# Patient Record
Sex: Female | Born: 1990
Health system: Southern US, Community
[De-identification: ages and names within clinical notes are randomized; demographics above are authoritative.]

## PROBLEM LIST (undated history)

## (undated) HISTORY — PX: TONSILLECTOMY: SUR1361

---
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
## 2012-08-12 ENCOUNTER — Emergency Department (HOSPITAL_BASED_OUTPATIENT_CLINIC_OR_DEPARTMENT_OTHER)
Admission: EM | Admit: 2012-08-12 | Discharge: 2012-08-12 | Disposition: A | Discharge status: Home / Self Care | Payer: BC Managed Care – PPO | Attending: Emergency Medicine | Admitting: Emergency Medicine

## 2012-08-12 ENCOUNTER — Encounter (HOSPITAL_BASED_OUTPATIENT_CLINIC_OR_DEPARTMENT_OTHER): Payer: Self-pay | Admitting: *Deleted

## 2012-08-12 DIAGNOSIS — J3489 Other specified disorders of nose and nasal sinuses: Secondary | ICD-10-CM | POA: Insufficient documentation

## 2012-08-12 DIAGNOSIS — A5901 Trichomonal vulvovaginitis: Secondary | ICD-10-CM | POA: Insufficient documentation

## 2012-08-12 DIAGNOSIS — A599 Trichomoniasis, unspecified: Secondary | ICD-10-CM

## 2012-08-12 DIAGNOSIS — J069 Acute upper respiratory infection, unspecified: Secondary | ICD-10-CM | POA: Insufficient documentation

## 2012-08-12 DIAGNOSIS — Z3202 Encounter for pregnancy test, result negative: Secondary | ICD-10-CM | POA: Insufficient documentation

## 2012-08-12 DIAGNOSIS — R05 Cough: Secondary | ICD-10-CM | POA: Insufficient documentation

## 2012-08-12 DIAGNOSIS — R059 Cough, unspecified: Secondary | ICD-10-CM | POA: Insufficient documentation

## 2012-08-12 LAB — RAPID URINE DRUG SCREEN, HOSP PERFORMED
Amphetamines: NOT DETECTED
Barbiturates: NOT DETECTED
Tetrahydrocannabinol: POSITIVE — AB

## 2012-08-12 LAB — URINALYSIS, ROUTINE W REFLEX MICROSCOPIC
Glucose, UA: NEGATIVE mg/dL
Ketones, ur: NEGATIVE mg/dL
Specific Gravity, Urine: 1.022 (ref 1.005–1.030)
Urobilinogen, UA: 1 mg/dL (ref 0.0–1.0)

## 2012-08-12 LAB — WET PREP, GENITAL: Yeast Wet Prep HPF POC: NONE SEEN

## 2012-08-12 LAB — URINE MICROSCOPIC-ADD ON

## 2012-08-12 LAB — PREGNANCY, URINE: Preg Test, Ur: NEGATIVE

## 2012-08-12 MED ORDER — ONDANSETRON 4 MG PO TBDP
4.0000 mg | ORAL_TABLET | Freq: Once | ORAL | Status: AC
  Administered 2012-08-12: 4 mg via ORAL
  Filled 2012-08-12: qty 1

## 2012-08-12 MED ORDER — METRONIDAZOLE 500 MG PO TABS
2000.0000 mg | ORAL_TABLET | Freq: Once | ORAL | Status: AC
  Administered 2012-08-12: 2000 mg via ORAL
  Filled 2012-08-12: qty 4

## 2012-08-12 NOTE — ED Provider Notes (Signed)
History     CSN: [id number]  Arrival date & time 08/12/12  1357   First MD Initiated Contact with Patient 08/12/12 1427      Chief Complaint  Patient presents with  . Nausea  . URI    (Consider location/radiation/quality/duration/timing/severity/associated sxs/prior treatment) Patient is a 22 y.o. female presenting with URI.  URI Presenting symptoms: cough and rhinorrhea   Presenting symptoms: no fever     Patient presents with 4 days of nasal congestion and cough, and two days of nausea and "just not feeling well."  She says about a week ago she was "in a situation" and was in a jail cell and thinks she got a cold from that.  She says she feels nauseated but has not thrown up, says she is eating and drinking OK.  Denies fever, diarrhea, abdominal pain, dysuria.  She is sexually active, does not use protection, but denies vaginal discharge or history of STD's.    History reviewed. No pertinent past medical history.  History reviewed. No pertinent past surgical history.  History reviewed. No pertinent family history.  History  Substance Use Topics  . Smoking status: Never Smoker   . Smokeless tobacco: Not on file  . Alcohol Use: Yes     Comment: occ  Admits to Marijuana use.   Review of Systems  Constitutional: Negative for fever.  HENT: Positive for rhinorrhea.   Eyes: Negative for visual disturbance.  Respiratory: Positive for cough. Negative for shortness of breath.   Cardiovascular: Negative for chest pain.  Gastrointestinal: Positive for nausea. Negative for vomiting and abdominal pain.  Genitourinary: Negative for dysuria and vaginal discharge.  Skin: Negative for rash.  Hematological: Negative for adenopathy.    Allergies  Review of patient's allergies indicates no known allergies.  Home Medications  No current outpatient prescriptions on file.  BP 138/92  Pulse 56  Temp(Src) 98.2 F (36.8 C) (Oral)  Resp 16  Ht 5' 10.5" (1.791 m)  Wt 165 lb (74.844  kg)  BMI 23.33 kg/m2  SpO2 100%  LMP 07/26/2012  Physical Exam  Constitutional: She appears well-developed and well-nourished. No distress.  HENT:  Head: Normocephalic and atraumatic.  Right Ear: Tympanic membrane normal.  Left Ear: Tympanic membrane normal.  Nose: Rhinorrhea present.  Mouth/Throat: Uvula is midline, oropharynx is clear and moist and mucous membranes are normal.  Eyes: EOM are normal. Pupils are equal, round, and reactive to light.  Cardiovascular: Normal rate, regular rhythm and normal heart sounds.   Pulmonary/Chest: Effort normal and breath sounds normal. She has no rales.  Abdominal: Soft. Bowel sounds are normal. There is no tenderness.  Genitourinary:  Thick white discharge present in vaginal vault, mild friability of cervix, no cervical motion tenderness, ovaries and uterus normal.   Musculoskeletal: She exhibits no edema.  Lymphadenopathy:    She has no cervical adenopathy.    ED Course  Procedures (including critical care time)  Labs Reviewed  URINALYSIS, ROUTINE W REFLEX MICROSCOPIC - Abnormal; Notable for the following:    APPearance CLOUDY (*)    Leukocytes, UA MODERATE (*)    All other components within normal limits  URINE RAPID DRUG SCREEN (HOSP PERFORMED) - Abnormal; Notable for the following:    Tetrahydrocannabinol POSITIVE (*)    All other components within normal limits  URINE MICROSCOPIC-ADD ON - Abnormal; Notable for the following:    Squamous Epithelial / LPF FEW (*)    Bacteria, UA FEW (*)    All other components within normal  limits  URINE CULTURE  GC/CHLAMYDIA PROBE AMP  WET PREP, GENITAL  PREGNANCY, URINE  HIV ANTIBODY (ROUTINE TESTING)  RPR   Trichomonas present in urine  No results found.   1. Trichomonas   2. URI (upper respiratory infection)       MDM  2G PO flagyl given in ER, GC/Chlamydia, wet prep, and HIV/RPR collected in ER. Discussed diagnosis, importance of partner being treated, and safe sex practices.          Ardyth Gal, MD 08/12/12 [phone]

## 2012-08-12 NOTE — ED Notes (Signed)
POCT Urine discontinued by lab. Urine Preg ordered.

## 2012-08-12 NOTE — ED Notes (Signed)
3 days cough cold symptoms as well as feeling nauseated

## 2012-08-12 NOTE — ED Provider Notes (Signed)
I saw and evaluated the patient, reviewed the resident's note and I agree with the findings and plan.  4 days of URI symptoms and nausea.  Afebrile. Nontoxic appearing.   Glynn Octave, MD 08/12/12 [phone]

## 2012-08-13 LAB — URINE CULTURE: Culture: NO GROWTH

## 2012-08-13 LAB — GC/CHLAMYDIA PROBE AMP
CT Probe RNA: NEGATIVE
GC Probe RNA: NEGATIVE

## 2012-08-13 LAB — HIV ANTIBODY (ROUTINE TESTING W REFLEX): HIV: NONREACTIVE

## 2012-08-15 ENCOUNTER — Telehealth (HOSPITAL_COMMUNITY): Payer: Self-pay | Admitting: Emergency Medicine

## 2013-08-31 ENCOUNTER — Encounter (HOSPITAL_BASED_OUTPATIENT_CLINIC_OR_DEPARTMENT_OTHER): Payer: Self-pay | Admitting: Emergency Medicine

## 2013-08-31 ENCOUNTER — Emergency Department (HOSPITAL_BASED_OUTPATIENT_CLINIC_OR_DEPARTMENT_OTHER): Payer: BC Managed Care – PPO

## 2013-08-31 ENCOUNTER — Emergency Department (HOSPITAL_BASED_OUTPATIENT_CLINIC_OR_DEPARTMENT_OTHER)
Admission: EM | Admit: 2013-08-31 | Discharge: 2013-08-31 | Disposition: A | Discharge status: Home / Self Care | Payer: BC Managed Care – PPO | Attending: Emergency Medicine | Admitting: Emergency Medicine

## 2013-08-31 DIAGNOSIS — N760 Acute vaginitis: Secondary | ICD-10-CM | POA: Insufficient documentation

## 2013-08-31 DIAGNOSIS — B3731 Acute candidiasis of vulva and vagina: Secondary | ICD-10-CM | POA: Insufficient documentation

## 2013-08-31 DIAGNOSIS — A499 Bacterial infection, unspecified: Secondary | ICD-10-CM | POA: Insufficient documentation

## 2013-08-31 DIAGNOSIS — N390 Urinary tract infection, site not specified: Secondary | ICD-10-CM | POA: Insufficient documentation

## 2013-08-31 DIAGNOSIS — B9689 Other specified bacterial agents as the cause of diseases classified elsewhere: Secondary | ICD-10-CM | POA: Insufficient documentation

## 2013-08-31 DIAGNOSIS — Z3202 Encounter for pregnancy test, result negative: Secondary | ICD-10-CM | POA: Insufficient documentation

## 2013-08-31 DIAGNOSIS — B373 Candidiasis of vulva and vagina: Secondary | ICD-10-CM

## 2013-08-31 LAB — CBC WITH DIFFERENTIAL/PLATELET
BASOS PCT: 0 % (ref 0–1)
Basophils Absolute: 0 10*3/uL (ref 0.0–0.1)
Eosinophils Absolute: 0 10*3/uL (ref 0.0–0.7)
Eosinophils Relative: 0 % (ref 0–5)
HEMATOCRIT: 40 % (ref 36.0–46.0)
HEMOGLOBIN: 13.7 g/dL (ref 12.0–15.0)
Lymphocytes Relative: 10 % — ABNORMAL LOW (ref 12–46)
Lymphs Abs: 1.9 10*3/uL (ref 0.7–4.0)
MCH: 31.1 pg (ref 26.0–34.0)
MCHC: 34.3 g/dL (ref 30.0–36.0)
MCV: 90.9 fL (ref 78.0–100.0)
Monocytes Absolute: 1.7 10*3/uL — ABNORMAL HIGH (ref 0.1–1.0)
Monocytes Relative: 9 % (ref 3–12)
NEUTROS ABS: 15.4 10*3/uL — AB (ref 1.7–7.7)
NEUTROS PCT: 81 % — AB (ref 43–77)
PLATELETS: 226 10*3/uL (ref 150–400)
RBC: 4.4 MIL/uL (ref 3.87–5.11)
RDW: 13.7 % (ref 11.5–15.5)
WBC: 19 10*3/uL — AB (ref 4.0–10.5)

## 2013-08-31 LAB — PREGNANCY, URINE: Preg Test, Ur: NEGATIVE

## 2013-08-31 LAB — COMPREHENSIVE METABOLIC PANEL
ALBUMIN: 4.9 g/dL (ref 3.5–5.2)
ALK PHOS: 71 U/L (ref 39–117)
ALT: 12 U/L (ref 0–35)
AST: 20 U/L (ref 0–37)
BUN: 13 mg/dL (ref 6–23)
CHLORIDE: 99 meq/L (ref 96–112)
CO2: 24 mEq/L (ref 19–32)
CREATININE: 1 mg/dL (ref 0.50–1.10)
Calcium: 10 mg/dL (ref 8.4–10.5)
GFR calc non Af Amer: 80 mL/min — ABNORMAL LOW (ref >90)
GLUCOSE: 94 mg/dL (ref 70–99)
Potassium: 3.8 mEq/L (ref 3.7–5.3)
Sodium: 139 mEq/L (ref 137–147)
TOTAL PROTEIN: 8.1 g/dL (ref 6.0–8.3)
Total Bilirubin: 0.7 mg/dL (ref 0.3–1.2)

## 2013-08-31 LAB — LIPASE, BLOOD: Lipase: 14 U/L (ref 11–59)

## 2013-08-31 LAB — URINALYSIS, ROUTINE W REFLEX MICROSCOPIC
Glucose, UA: NEGATIVE mg/dL
KETONES UR: 15 mg/dL — AB
NITRITE: NEGATIVE
Protein, ur: 100 mg/dL — AB
SPECIFIC GRAVITY, URINE: 1.031 — AB (ref 1.005–1.030)
Urobilinogen, UA: 1 mg/dL (ref 0.0–1.0)
pH: 7.5 (ref 5.0–8.0)

## 2013-08-31 LAB — HIV ANTIBODY (ROUTINE TESTING W REFLEX): HIV: NONREACTIVE

## 2013-08-31 LAB — WET PREP, GENITAL: Trich, Wet Prep: NONE SEEN

## 2013-08-31 LAB — URINE MICROSCOPIC-ADD ON

## 2013-08-31 MED ORDER — IOHEXOL 300 MG/ML  SOLN
50.0000 mL | Freq: Once | INTRAMUSCULAR | Status: AC | PRN
  Administered 2013-08-31: 50 mL via ORAL

## 2013-08-31 MED ORDER — HYDROCODONE-ACETAMINOPHEN 5-325 MG PO TABS: 1.0000 | ORAL_TABLET | Freq: Four times a day (QID) | ORAL | Status: AC | PRN

## 2013-08-31 MED ORDER — IOHEXOL 300 MG/ML  SOLN
100.0000 mL | Freq: Once | INTRAMUSCULAR | Status: AC | PRN
  Administered 2013-08-31: 100 mL via INTRAVENOUS

## 2013-08-31 MED ORDER — FLUCONAZOLE 100 MG PO TABS: 100.0000 mg | ORAL_TABLET | Freq: Every day | ORAL | Status: AC

## 2013-08-31 MED ORDER — MORPHINE SULFATE 4 MG/ML IJ SOLN
4.0000 mg | Freq: Once | INTRAMUSCULAR | Status: AC
  Administered 2013-08-31: 4 mg via INTRAVENOUS
  Filled 2013-08-31: qty 1

## 2013-08-31 MED ORDER — ONDANSETRON HCL 4 MG/2ML IJ SOLN
4.0000 mg | Freq: Once | INTRAMUSCULAR | Status: AC
  Administered 2013-08-31: 4 mg via INTRAVENOUS
  Filled 2013-08-31: qty 2

## 2013-08-31 MED ORDER — METRONIDAZOLE 500 MG PO TABS: 500.0000 mg | ORAL_TABLET | Freq: Two times a day (BID) | ORAL | Status: AC

## 2013-08-31 MED ORDER — SULFAMETHOXAZOLE-TMP DS 800-160 MG PO TABS: 1.0000 | ORAL_TABLET | Freq: Two times a day (BID) | ORAL | Status: AC

## 2013-08-31 NOTE — ED Notes (Signed)
Pt. Stated abd pain started 07/31/13 all of a sudden. Pain 10/10. Pt been having nausea. Pt denies any problems urinating just has heaviness in abdomen. Denies vomiting, diarrhea and vaginal discharge.

## 2013-08-31 NOTE — ED Notes (Signed)
Patient given warm blanket.

## 2013-08-31 NOTE — Discharge Instructions (Signed)
Bacterial Vaginosis °Bacterial vaginosis is a vaginal infection that occurs when the normal balance of bacteria in the vagina is disrupted. It results from an overgrowth of certain bacteria. This is the most common vaginal infection in women of childbearing age. Treatment is important to prevent complications, especially in pregnant women, as it can cause a premature delivery. °CAUSES  °Bacterial vaginosis is caused by an increase in harmful bacteria that are normally present in smaller amounts in the vagina. Several different kinds of bacteria can cause bacterial vaginosis. However, the reason that the condition develops is not fully understood. °RISK FACTORS °Certain activities or behaviors can put you at an increased risk of developing bacterial vaginosis, including: °· Having a new sex partner or multiple sex partners. °· Douching. °· Using an intrauterine device (IUD) for contraception. °Women do not get bacterial vaginosis from toilet seats, bedding, swimming pools, or contact with objects around them. °SIGNS AND SYMPTOMS  °Some women with bacterial vaginosis have no signs or symptoms. Common symptoms include: °· Grey vaginal discharge. °· A fishlike odor with discharge, especially after sexual intercourse. °· Itching or burning of the vagina and vulva. °· Burning or pain with urination. °DIAGNOSIS  °Your health care provider will take a medical history and examine the vagina for signs of bacterial vaginosis. A sample of vaginal fluid may be taken. Your health care provider will look at this sample under a microscope to check for bacteria and abnormal cells. A vaginal pH test may also be done.  °TREATMENT  °Bacterial vaginosis may be treated with antibiotic medicines. These may be given in the form of a pill or a vaginal cream. A second round of antibiotics may be prescribed if the condition comes back after treatment.  °HOME CARE INSTRUCTIONS  °· Only take over-the-counter or prescription medicines as  directed by your health care provider. °· If antibiotic medicine was prescribed, take it as directed. Make sure you finish it even if you start to feel better. °· Do not have sex until treatment is completed. °· Tell all sexual partners that you have a vaginal infection. They should see their health care provider and be treated if they have problems, such as a mild rash or itching. °· Practice safe sex by using condoms and only having one sex partner. °SEEK MEDICAL CARE IF:  °· Your symptoms are not improving after 3 days of treatment. °· You have increased discharge or pain. °· You have a fever. °MAKE SURE YOU:  °· Understand these instructions. °· Will watch your condition. °· Will get help right away if you are not doing well or get worse. °FOR MORE INFORMATION  °Centers for Disease Control and Prevention, Division of STD Prevention: www.cdc.gov/std °American Sexual Health Association (ASHA): www.ashastd.org  °Document Released: 04/16/2005 Document Revised: 02/04/2013 Document Reviewed: 11/26/2012 °ExitCare® Patient Information ©2014 [employer]. °Urinary Tract Infection °A urinary tract infection (UTI) can occur any place along the urinary tract. The tract includes the kidneys, ureters, bladder, and urethra. A type of germ called bacteria often causes a UTI. UTIs are often helped with antibiotic medicine.  °HOME CARE  °· If given, take antibiotics as told by your doctor. Finish them even if you start to feel better. °· Drink enough fluids to keep your pee (urine) clear or pale yellow. °· Avoid tea, drinks with caffeine, and bubbly (carbonated) drinks. °· Pee often. Avoid holding your pee in for a long time. °· Pee before and after having sex (intercourse). °· Wipe from front to back   after you poop (bowel movement) if you are a woman. Use each tissue only once. °GET HELP RIGHT AWAY IF:  °· You have back pain. °· You have lower belly (abdominal) pain. °· You have chills. °· You feel sick to your stomach  (nauseous). °· You throw up (vomit). °· Your burning or discomfort with peeing does not go away. °· You have a fever. °· Your symptoms are not better in 3 days. °MAKE SURE YOU:  °· Understand these instructions. °· Will watch your condition. °· Will get help right away if you are not doing well or get worse. °Document Released: 10/03/2007 Document Revised: 01/09/2012 Document Reviewed: 11/15/2011 °ExitCare® Patient Information ©2014 [employer]. ° °

## 2013-08-31 NOTE — ED Provider Notes (Signed)
CSN: [id number]     Arrival date & time 08/31/13  1453 History   First MD Initiated Contact with Patient 08/31/13 1500     Chief Complaint  Patient presents with  . Abdominal Pain     (Consider location/radiation/quality/duration/timing/severity/associated sxs/prior Treatment) HPI Comments: Pt c/o generalized lower abdominal pain that started yesterday. Denies vomiting, dysuria, vaginal discharge or diarrhea. No fever. Pt states that she has no history of similar symptoms. Pain is about the her umbilical area. Normal bm.   The history is provided by the patient. No language interpreter was used.    History reviewed. No pertinent past medical history. Past Surgical History  Procedure Laterality Date  . Tonsillectomy     No family history on file. History  Substance Use Topics  . Smoking status: Never Smoker   . Smokeless tobacco: Not on file  . Alcohol Use: Yes     Comment: occ   OB History   Grav Para Term Preterm Abortions TAB SAB Ect Mult Living                 Review of Systems  Constitutional: Negative.   Respiratory: Negative.   Cardiovascular: Negative.       Allergies  Review of patient's allergies indicates no known allergies.  Home Medications   Prior to Admission medications   Not on File   BP 150/85  Pulse 92  Temp(Src) 98.8 F (37.1 C) (Oral)  Resp 16  Ht 5\' 10"  (1.778 m)  Wt 165 lb (74.844 kg)  BMI 23.68 kg/m2  SpO2 100%  LMP 08/10/2013 Physical Exam  Nursing note and vitals reviewed. Constitutional: She is oriented to person, place, and time. She appears well-developed and well-nourished.  HENT:  Head: Atraumatic.  Cardiovascular: Normal rate and regular rhythm.   Pulmonary/Chest: Effort normal and breath sounds normal.  Abdominal: Soft. There is tenderness in the periumbilical area. There is guarding.  Genitourinary:  White discharge, malodarous  Musculoskeletal: Normal range of motion.  Neurological: She is alert and oriented to  person, place, and time.  Skin: Skin is warm and dry.  Psychiatric: She has a normal mood and affect.    ED Course  Procedures (including critical care time) Labs Review Labs Reviewed  URINALYSIS, ROUTINE W REFLEX MICROSCOPIC - Abnormal; Notable for the following:    Color, Urine AMBER (*)    APPearance CLOUDY (*)    Specific Gravity, Urine 1.031 (*)    Hgb urine dipstick MODERATE (*)    Bilirubin Urine SMALL (*)    Ketones, ur 15 (*)    Protein, ur 100 (*)    Leukocytes, UA MODERATE (*)    All other components within normal limits  COMPREHENSIVE METABOLIC PANEL - Abnormal; Notable for the following:    GFR calc non Af Amer 80 (*)    All other components within normal limits  CBC WITH DIFFERENTIAL - Abnormal; Notable for the following:    WBC 19.0 (*)    Neutrophils Relative % 81 (*)    Neutro Abs 15.4 (*)    Lymphocytes Relative 10 (*)    Monocytes Absolute 1.7 (*)    All other components within normal limits  URINE MICROSCOPIC-ADD ON - Abnormal; Notable for the following:    Squamous Epithelial / LPF MANY (*)    Bacteria, UA FEW (*)    All other components within normal limits  GC/CHLAMYDIA PROBE AMP  WET PREP, GENITAL  PREGNANCY, URINE  LIPASE, BLOOD  HIV ANTIBODY (ROUTINE TESTING)  Imaging Review Ct Abdomen Pelvis W Contrast  08/31/2013   CLINICAL DATA:  Abdominal pain beginning 1 day ago.  EXAM: CT ABDOMEN AND PELVIS WITH CONTRAST  TECHNIQUE: Multidetector CT imaging of the abdomen and pelvis was performed using the standard protocol following bolus administration of intravenous contrast.  CONTRAST:  50 mL OMNIPAQUE IOHEXOL 300 MG/ML SOLN, 100 mL OMNIPAQUE IOHEXOL 300 MG/ML SOLN  COMPARISON:  None.  FINDINGS: The lung bases demonstrate only mild atelectatic change on the left. There is no pleural or pericardial effusion.  The gallbladder, liver, spleen, adrenal glands, pancreas, kidneys and biliary tree appear normal. A very small amount of free pelvic fluid is  compatible with physiologic change. Uterus, adnexa and urinary bladder appear normal. The stomach, small and large bowel and appendix all appear normal. There is no lymphadenopathy. No focal bony abnormality is identified.  IMPRESSION: Negative for appendicitis or other acute abnormality. Negative abdomen and pelvis CT scan.   Electronically Signed   By: Drusilla Kannerhomas  Dalessio M.D.   On: 08/31/2013 16:21     EKG Interpretation None      MDM   Final diagnoses:  UTI (lower urinary tract infection)  Yeast vaginitis  BV (bacterial vaginosis)    No acute abnormality noted on ct. Pt is not pregnant. Will treat for bv, uti and yeast. Std cultures sent. Urine culture sent    Teressa LowerVrinda Hever Castilleja, NP 08/31/13 1654

## 2013-09-01 LAB — GC/CHLAMYDIA PROBE AMP
CT Probe RNA: NEGATIVE
GC Probe RNA: NEGATIVE

## 2013-09-01 NOTE — ED Provider Notes (Signed)
Medical screening examination/treatment/procedure(s) were performed by non-physician practitioner and as supervising physician I was immediately available for consultation/collaboration.     Geoffery Lyonsouglas Jaliana Medellin, MD 09/01/13 [phone]

## 2013-09-02 LAB — URINE CULTURE: Colony Count: >=100000

## 2013-12-27 ENCOUNTER — Encounter (HOSPITAL_BASED_OUTPATIENT_CLINIC_OR_DEPARTMENT_OTHER): Payer: Self-pay | Admitting: Emergency Medicine

## 2013-12-27 ENCOUNTER — Emergency Department (HOSPITAL_BASED_OUTPATIENT_CLINIC_OR_DEPARTMENT_OTHER): Payer: BC Managed Care – PPO

## 2013-12-27 ENCOUNTER — Emergency Department (HOSPITAL_BASED_OUTPATIENT_CLINIC_OR_DEPARTMENT_OTHER)
Admission: EM | Admit: 2013-12-27 | Discharge: 2013-12-27 | Disposition: A | Discharge status: Home / Self Care | Payer: BC Managed Care – PPO | Attending: Emergency Medicine | Admitting: Emergency Medicine

## 2013-12-27 DIAGNOSIS — M25579 Pain in unspecified ankle and joints of unspecified foot: Secondary | ICD-10-CM | POA: Diagnosis not present

## 2013-12-27 DIAGNOSIS — Z7901 Long term (current) use of anticoagulants: Secondary | ICD-10-CM | POA: Insufficient documentation

## 2013-12-27 DIAGNOSIS — Z880 Allergy status to penicillin: Secondary | ICD-10-CM | POA: Diagnosis not present

## 2013-12-27 DIAGNOSIS — M79609 Pain in unspecified limb: Secondary | ICD-10-CM | POA: Insufficient documentation

## 2013-12-27 DIAGNOSIS — M79671 Pain in right foot: Secondary | ICD-10-CM

## 2013-12-27 DIAGNOSIS — Z792 Long term (current) use of antibiotics: Secondary | ICD-10-CM | POA: Diagnosis not present

## 2013-12-27 DIAGNOSIS — M79604 Pain in right leg: Secondary | ICD-10-CM

## 2013-12-27 MED ORDER — NAPROXEN 500 MG PO TABS: 500.0000 mg | ORAL_TABLET | Freq: Two times a day (BID) | ORAL | Status: AC

## 2013-12-27 NOTE — ED Provider Notes (Signed)
CSN: [id number]     Arrival date & time 12/27/13  1248 History   First MD Initiated Contact with Patient 12/27/13 1325     Chief Complaint  Patient presents with  . Leg Pain      HPI  Includes pain right heel. No pain or. Limping because of the heel pain now feels tightness and swelling in right calf. Normally works out at [employer] is limited by the pain last week. PE. History DVT. No family history of clotting disorders.  History reviewed. No pertinent past medical history. Past Surgical History  Procedure Laterality Date  . Tonsillectomy     No family history on file. History  Substance Use Topics  . Smoking status: Never Smoker   . Smokeless tobacco: Not on file  . Alcohol Use: Yes     Comment: occ   OB History   Grav Para Term Preterm Abortions TAB SAB Ect Mult Living                 Review of Systems Limited in the right heel. No tightness in the cath. No pain in the thigh. Pulmonary: no difficult breathing cardiac-no symptoms, No other areas of pain or swelling. You of systems otherwise complete and negative.   Allergies  Penicillins  Home Medications   Prior to Admission medications   Medication Sig Start Date End Date Taking? Authorizing Provider  HYDROcodone-acetaminophen (NORCO/VICODIN) 5-325 MG per tablet Take 1-2 tablets by mouth every 6 (six) hours as needed. 08/31/13   Teressa Lower, NP  metroNIDAZOLE (FLAGYL) 500 MG tablet Take 1 tablet (500 mg total) by mouth 2 (two) times daily. 08/31/13   Teressa Lower, NP  naproxen (NAPROSYN) 500 MG tablet Take 1 tablet (500 mg total) by mouth 2 (two) times daily. 12/27/13   Rolland Porter, MD  sulfamethoxazole-trimethoprim (BACTRIM DS) 800-160 MG per tablet Take 1 tablet by mouth 2 (two) times daily. 08/31/13   Teressa Lower, NP   BP 134/93  Pulse 65  Temp(Src) 97.9 F (36.6 C) (Oral)  Resp 20  Ht  (1.778 m)  Wt 180 lb (81.647 kg)  BMI 25.83 kg/m2  SpO2 100%  LMP 12/10/2013 Physical Exam  Normal pulses  or other extremity. No soft tissue swelling noted. However, her right leg is 1 cm bigger than her left. No edema distally. Good capillary refill and sensation. Tenderness on the heel pad. No erythema warmth or cellulitis.  ED Course  Procedures (including critical care time) Labs Review Labs Reviewed - No data to display  Imaging Review US Venous Img Lower Unilateral Right  12/27/2013   CLINICAL DATA:  Right leg and calf pain  EXAM: Right LOWER EXTREMITY VENOUS DOPPLER ULTRASOUND  TECHNIQUE: Gray-scale sonography with graded compression, as well as color Doppler and duplex ultrasound were performed to evaluate the lower extremity deep venous systems from the level of the common femoral vein and including the common femoral, femoral, profunda femoral, popliteal and calf veins including the posterior tibial, peroneal and gastrocnemius veins when visible. The superficial great saphenous vein was also interrogated. Spectral Doppler was utilized to evaluate flow at rest and with distal augmentation maneuvers in the common femoral, femoral and popliteal veins.  COMPARISON:  None.  FINDINGS: Common Femoral Vein: No evidence of thrombus. Normal compressibility, respiratory phasicity and response to augmentation.  Saphenofemoral Junction: No evidence of thrombus. Normal compressibility and flow on color Doppler imaging.  Profunda Femoral Vein: No evidence of thrombus. Normal compressibility and flow on color Doppler  imaging.  Femoral Vein: No evidence of thrombus. Normal compressibility, respiratory phasicity and response to augmentation.  Popliteal Vein: No evidence of thrombus. Normal compressibility, respiratory phasicity and response to augmentation.  Calf Veins: No evidence of thrombus. Normal compressibility and flow on color Doppler imaging.  Superficial Great Saphenous Vein: No evidence of thrombus. Normal compressibility and flow on color Doppler imaging.  Venous Reflux:  None.  Other Findings:  None.   IMPRESSION: No evidence of deep venous thrombosis.   Electronically Signed   By: Alcide Clever M.D.   On: 12/27/2013 14:13     EKG Interpretation None      MDM   Final diagnoses:  Pain of right lower extremity  Heel pain, right        Rolland Porter, MD 12/30/13 [phone]

## 2013-12-27 NOTE — ED Notes (Signed)
PT discharged to home with family, NAD.  

## 2013-12-27 NOTE — Discharge Instructions (Signed)
Heel Compression Syndrome  with Rehab Heel compression syndrome causes shrinkage (atrophy) of the heel pad, which causes heel pain. A fat pad exists beneath the heel bone (calcaneus) that absorbs shock and other forces that are applied to the foot. Shrinkage of this tissue results in an inability to withstand normal forces applied to the foot.  SYMPTOMS   General pain on the bottom of the heel.  Pain that gets worse when running on hard surfaces or in a shoe with poor shock absorbers.  No swelling or increased warmth.  Less cushion on bottom of the heel. CAUSES  Heel compression syndrome is caused by shrinkage of the heel pad, after the age of 7. This process may be increased with distance running or with fracture of the heel bone or other injury to the heel. RISK INCREASES WITH:  Sports that require running on a hard surface.  Prolonged standing.  Poor strength and flexibility.  Shoes with poor absorbers.  Obesity.  Flat feet.  Fracture of the heel bone. PREVENTION   Warm up and stretch properly before activity.  Maintain physical fitness:  Strength, flexibility and endurance.  Cardiovascular fitness.  Maintain a healthy body weight.  Avoid activities that place constant or repeated strain on the foot.  Wear properly fitted and padded shoes.  Change shoes every 300 to 500 miles.  When possible, run on soft surfaces.  Wear a heel lift to reduce pressure to the heel (pushing weight to the front of the foot).  Emphasize cross training. PROGNOSIS  If treated properly, heel compression syndrome may be cured. However, sometimes heel compression syndrome results in a chronic condition. RELATED COMPLICATIONS  Frequently recurring symptoms, resulting in a chronic problem that often affects your ability to compete.  TREATMENT  Treatment first involves ice and medicine to reduce pain and inflammation. It is important to perform exercises that stretch the heel cord, and  to modify activities that aggravate symptoms. These exercises may be performed at home or with a therapist. You may find changing shoes or using a heel insert helpful in reducing pain and discomfort. There are no surgical procedures that exist to treat this condition.  MEDICATION  If pain medicine is needed, nonsteroidal anti-inflammatory medicines (aspirin and ibuprofen), or other minor pain relievers (acetaminophen), are often advised  Prescription pain relievers may be given if your caregiver thinks they are needed. Use only as directed and only as much as you need. HEAT AND COLD  Cold treatment (icing) relieves pain and reduces inflammation. Cold treatment should be applied for 10 to 15 minutes every 2 to 3 hours, and immediately after activity that aggravates your symptoms. Use ice packs or an ice massage.  Heat treatment may be used before performing stretching and strengthening activities prescribed by your caregiver, physical therapist, or athletic trainer. Use a heat pack or a warm water soak. SEEK MEDICAL CARE IF:  Symptoms get worse or do not improve in 2 weeks, despite treatment. EXERCISES  STRETCHING EXERCISES - Heel Compression Syndrome (Fat Pad Atrophy) These exercises may help you when beginning to rehabilitate your injury. Your symptoms may go away with or without further involvement from your physician, physical therapist or athletic trainer. While completing these exercises, remember:   Restoring tissue flexibility helps normal motion to return to the joints. This allows healthier, less painful movement and activity.  An effective stretch should be held for at least 30 seconds.  A stretch should never be painful. You should only feel a gentle lengthening  or release in the stretched tissue. STRETCH - Gastroc, Standing   Place your hands on a wall.  Extend your right / left leg behind you, keeping the front knee somewhat bent.  Slightly point your toes inward on your back  foot.  Keeping your right / left heel on the floor and your knee straight, shift your weight toward the wall, not allowing your back to arch.  You should feel a gentle stretch in the right / left calf. Hold this position Repeat __________ times. Complete this stretch __________ times per day.

## 2013-12-27 NOTE — ED Notes (Signed)
PT presents to ED with complaints of rt calf pain for 2 days

## 2015-02-19 IMAGING — CT CT ABD-PELV W/ CM
2 of 4 series · 16 of 46 positions shown, 18 images · IV contrast (APPLIED)
Comparison: None.

CLINICAL DATA: Abdominal pain beginning 1 day ago.

EXAM:
CT ABDOMEN AND PELVIS WITH CONTRAST
TECHNIQUE: Multidetector CT imaging of the abdomen and pelvis was performed
using the standard protocol following bolus administration of
intravenous contrast.
CONTRAST:  50 mL OMNIPAQUE IOHEXOL 300 MG/ML SOLN, 100 mL OMNIPAQUE
IOHEXOL 300 MG/ML SOLN

[Series 2: abd/pelvis 5.0 b31f · axial · 0.83mm/px · z∈[-419,+36]mm · 13 of 99 slices shown, 15 images]
[im 4/99  soft-tissue]
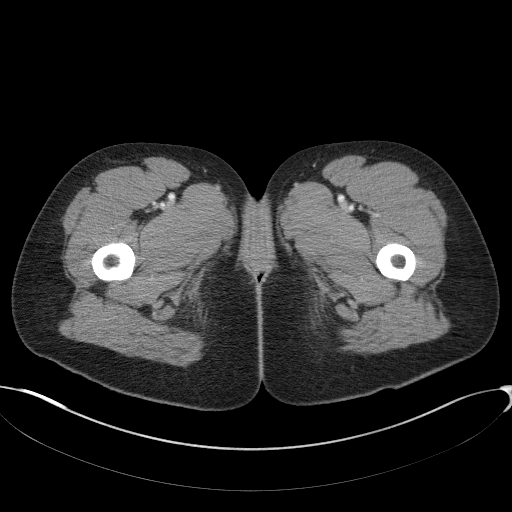
[im 4/99  bone]
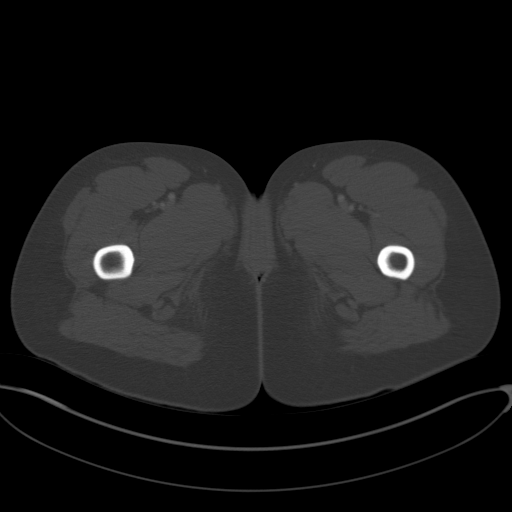
[im 12/99  soft-tissue]
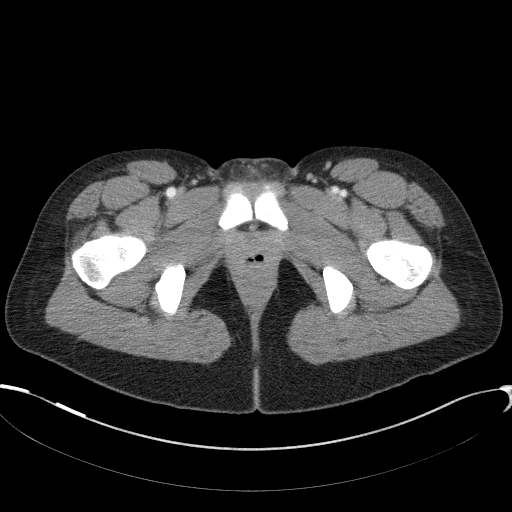
[im 20/99  soft-tissue]
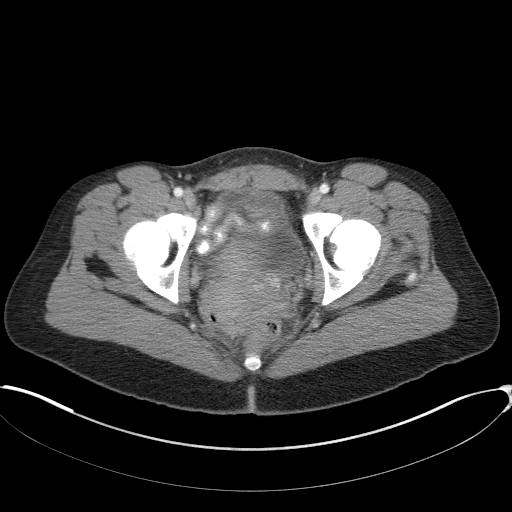
[im 28/99  soft-tissue]
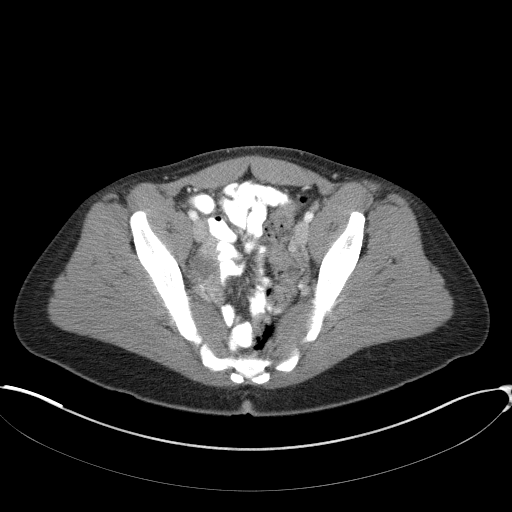
[im 36/99  soft-tissue]
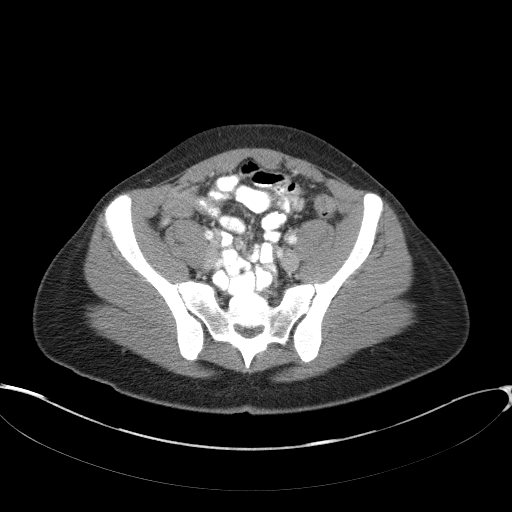
[im 44/99  soft-tissue]
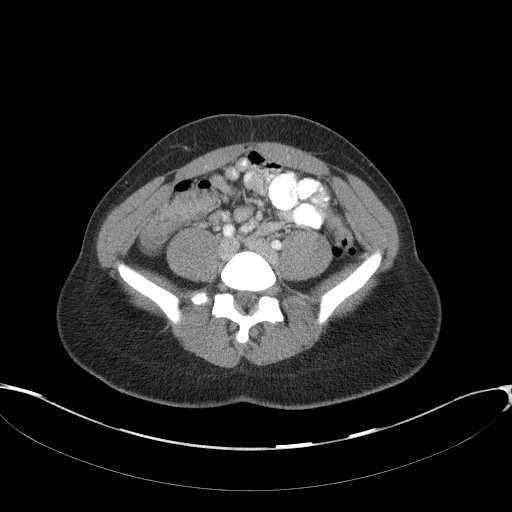
[im 51/99  soft-tissue]
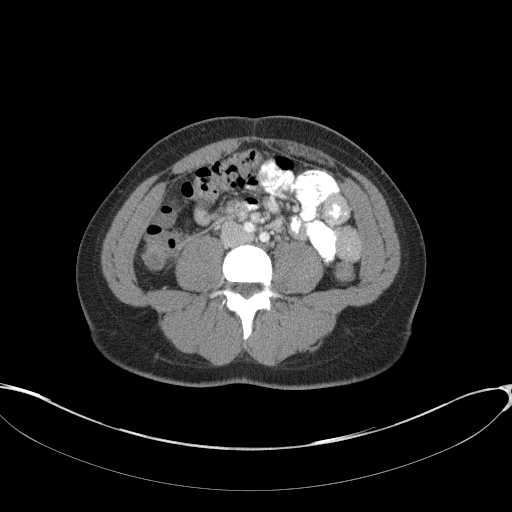
[im 55/99  soft-tissue]
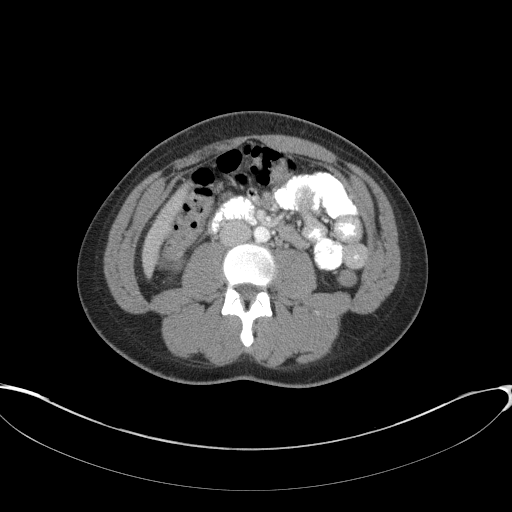
[im 63/99  soft-tissue]
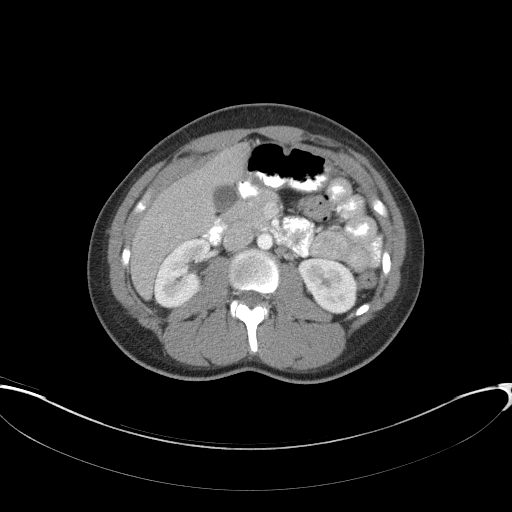
[im 63/99  bone]
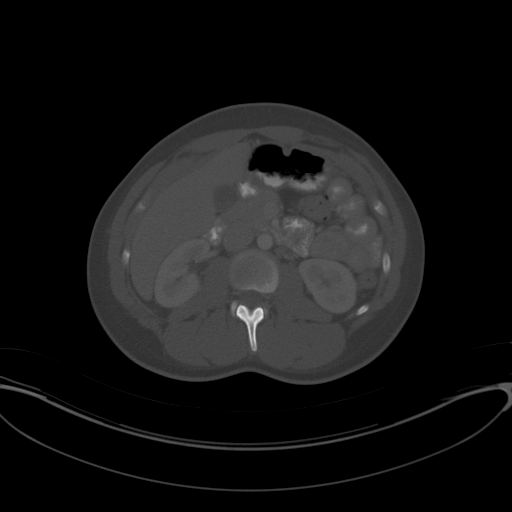
[im 71/99  soft-tissue]
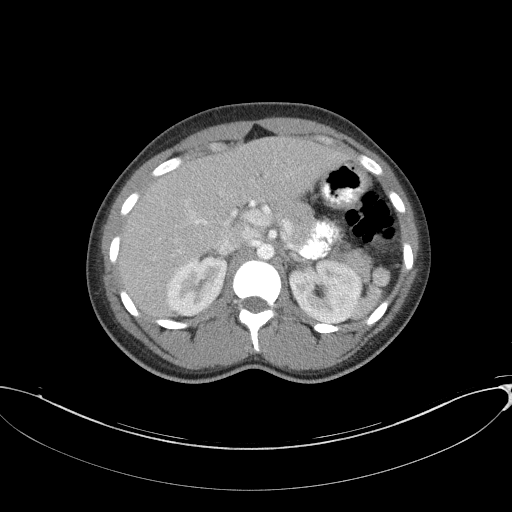
[im 79/99  soft-tissue]
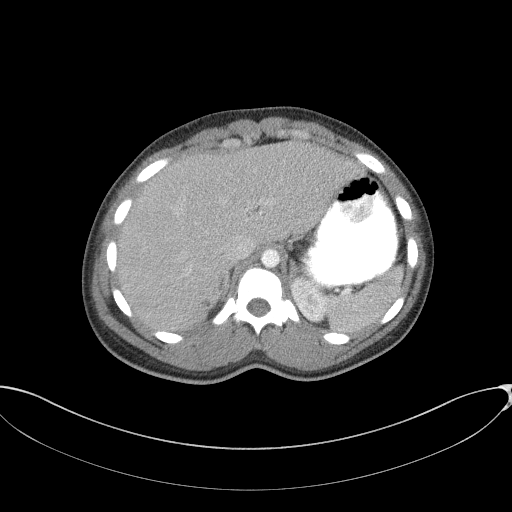
[im 87/99  soft-tissue]
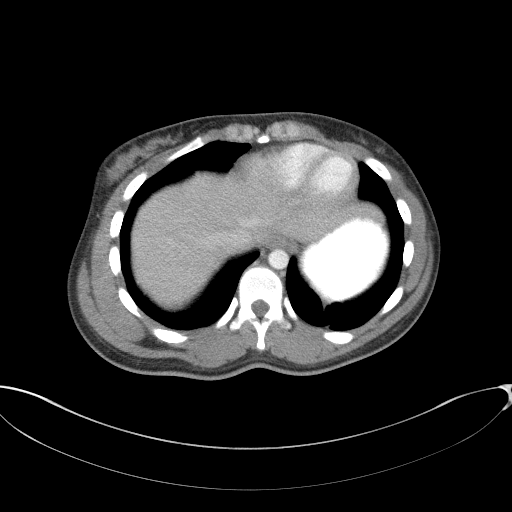
[im 95/99  soft-tissue]
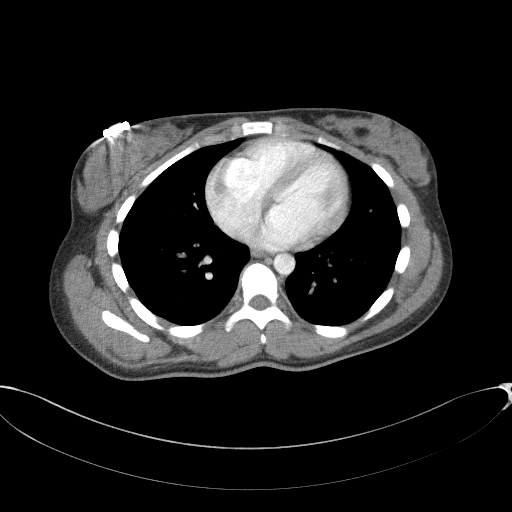

[Series 5: abd/pelvis 3.0 coronal · coronal · 0.76mm/px · 3 of 86 slices shown]
[im 29/86  soft-tissue]
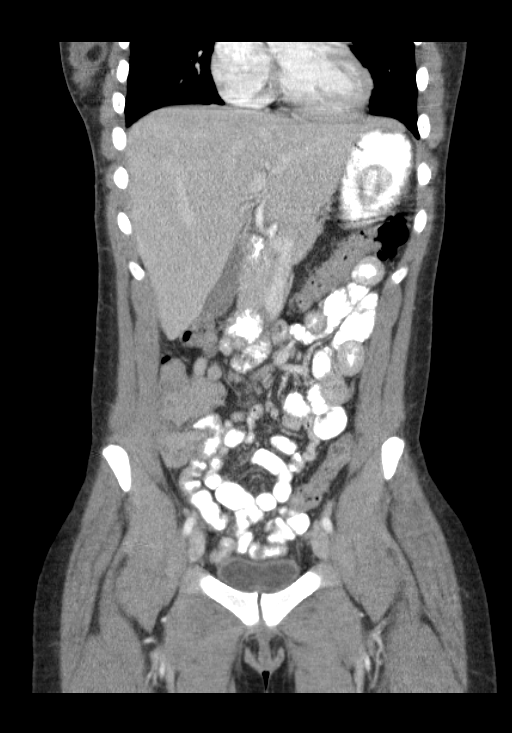
[im 38/86  soft-tissue]
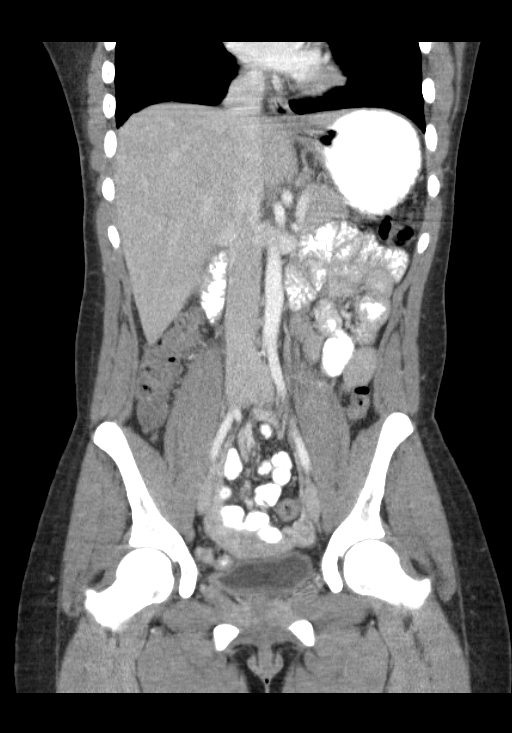
[im 48/86  soft-tissue]
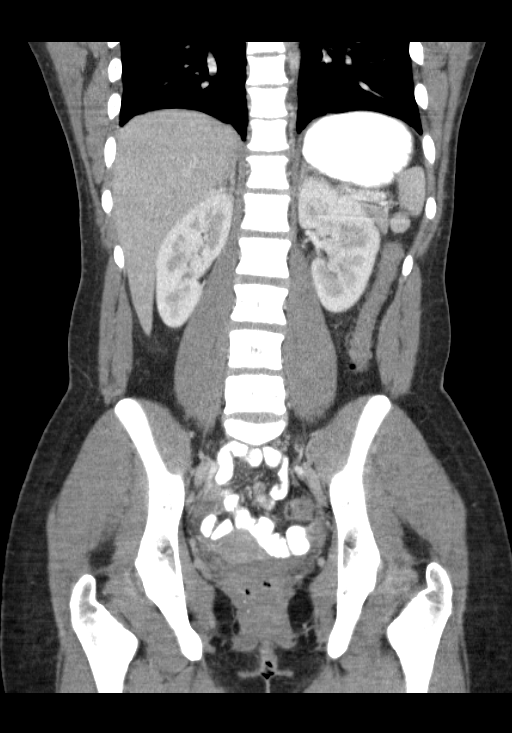

[16 of 46 positions shown; findings below may reference images not displayed]

FINDINGS: The lung bases demonstrate only mild atelectatic change on the left.
There is no pleural or pericardial effusion.

The gallbladder, liver, spleen, adrenal glands, pancreas, kidneys
and biliary tree appear normal. A very small amount of free pelvic
fluid is compatible with physiologic change. Uterus, adnexa and
urinary bladder appear normal. The stomach, small and large bowel
and appendix all appear normal. There is no lymphadenopathy. No
focal bony abnormality is identified.
IMPRESSION: Negative for appendicitis or other acute abnormality. Negative
abdomen and pelvis CT scan.

## 2015-06-17 IMAGING — US US EXTREM LOW VENOUS*R*
1 series · 13 of 24 positions shown · non-contrast
Comparison: None.

CLINICAL DATA: Right leg and calf pain



[Series 1: us extrem low venous*right* · 0.06mm/px · 13 of 37 slices shown]
[im 1/37]
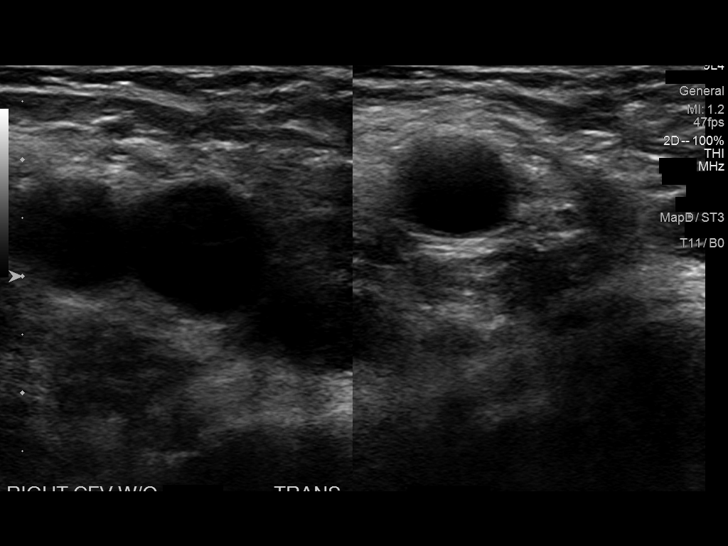
[im 4/37]
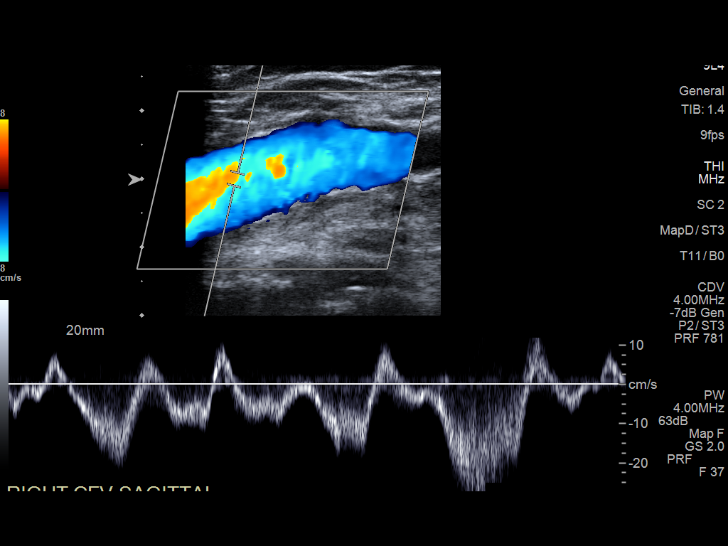
[im 7/37]
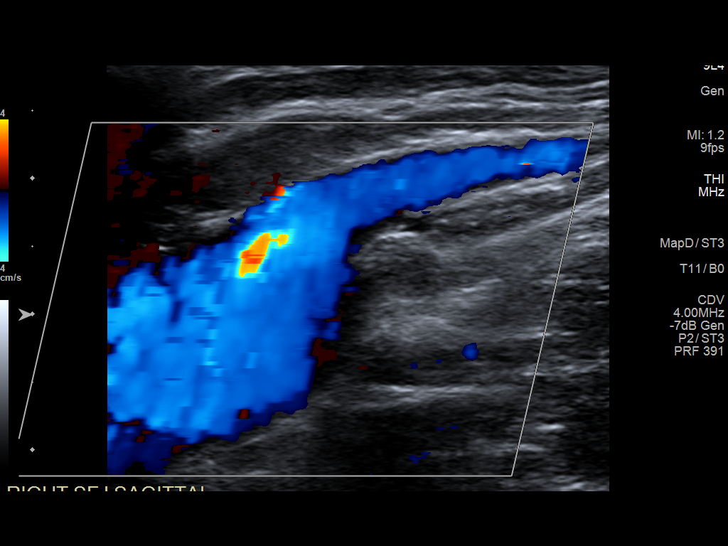
[im 10/37]
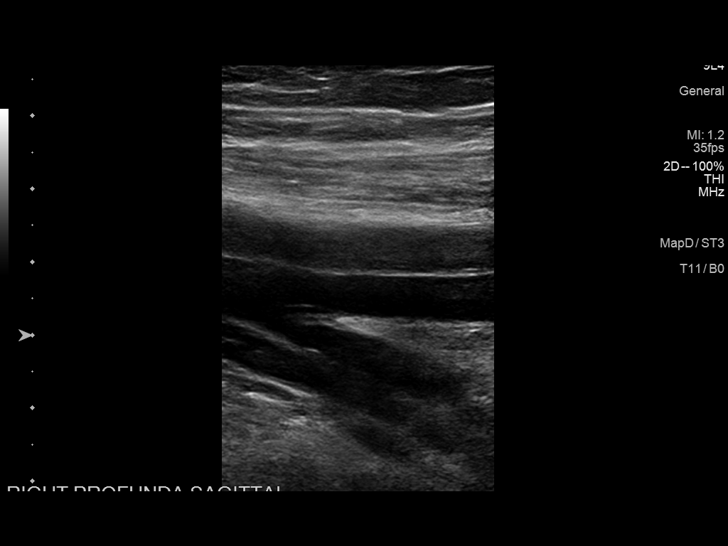
[im 13/37]
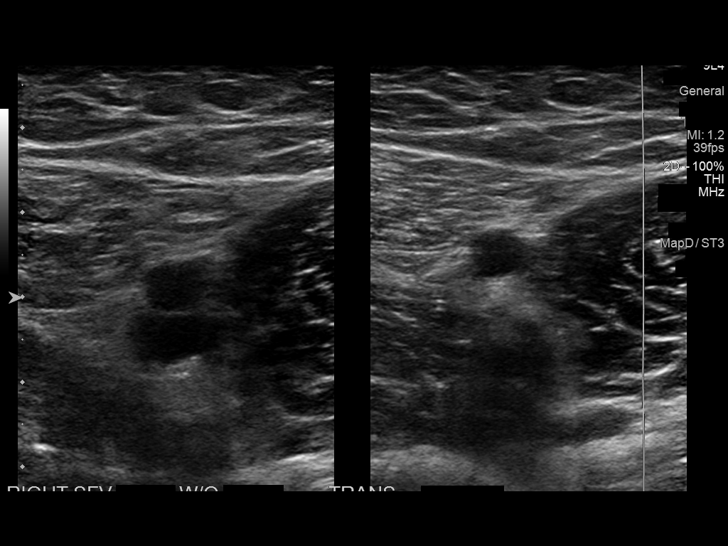
[im 16/37]
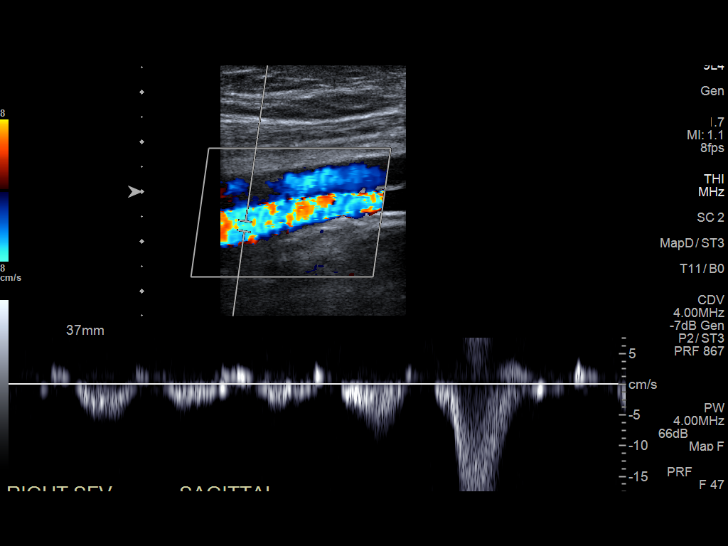
[im 19/37]
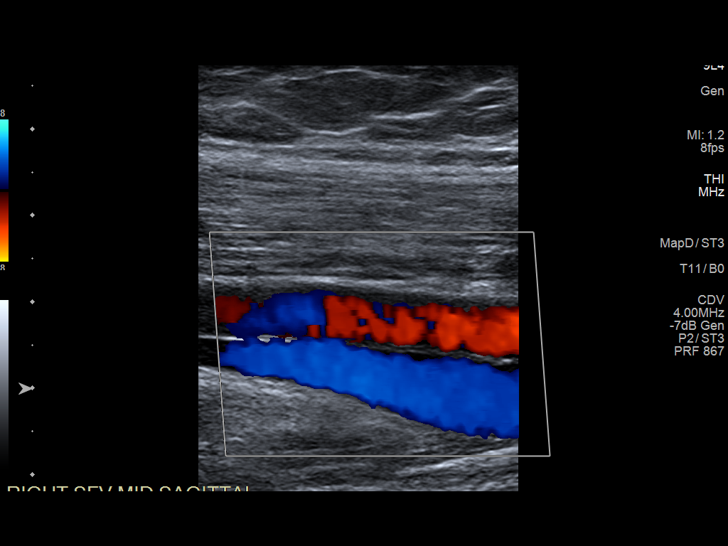
[im 21/37]
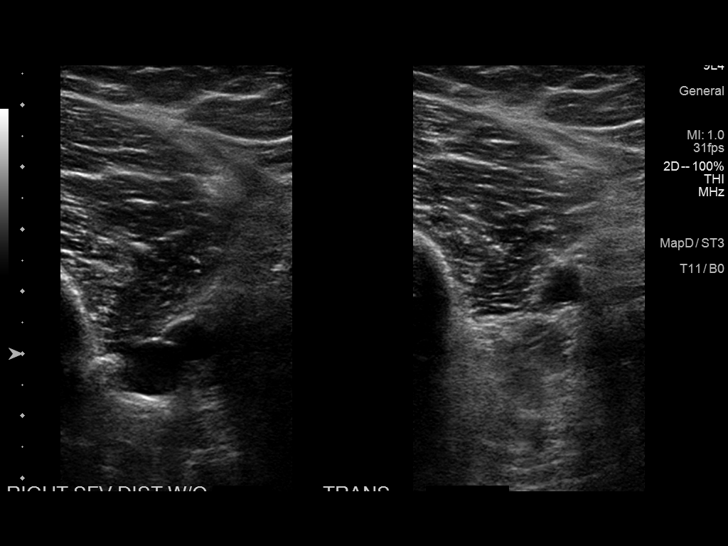
[im 24/37]
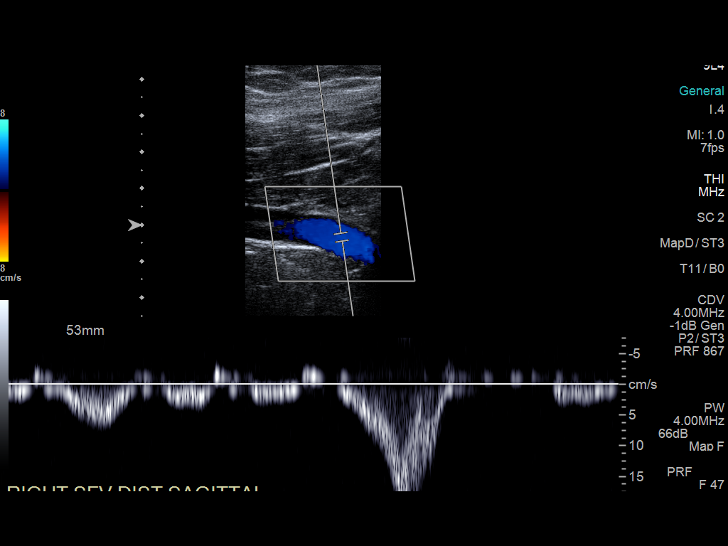
[im 27/37]
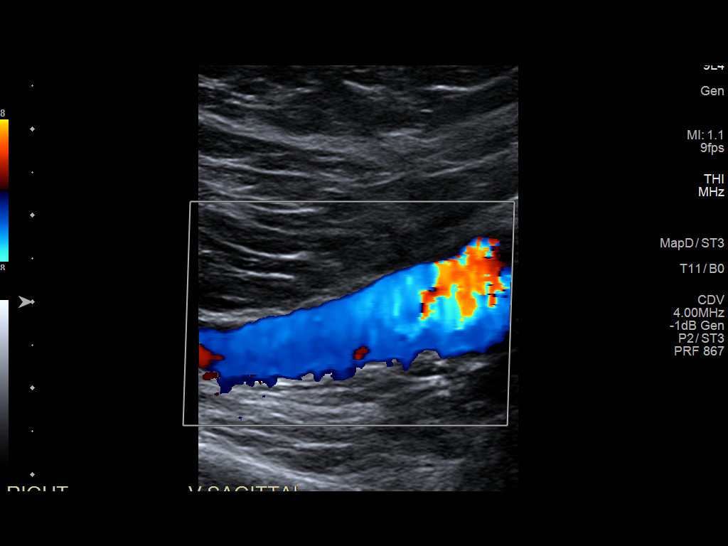
[im 30/37]
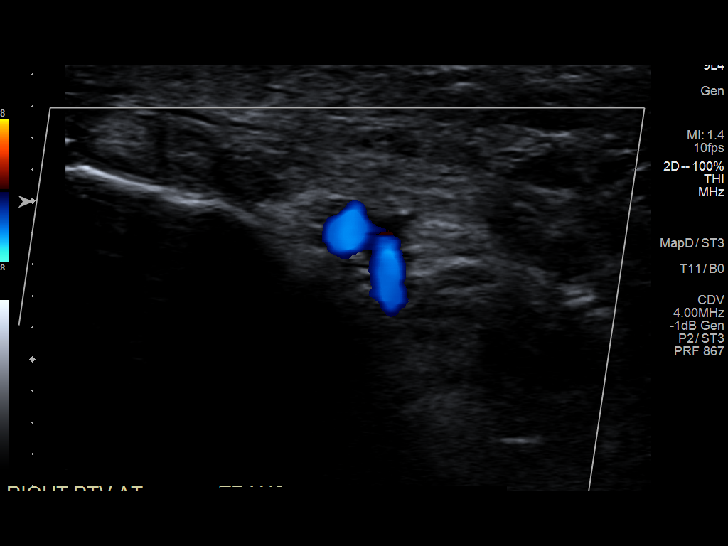
[im 33/37]
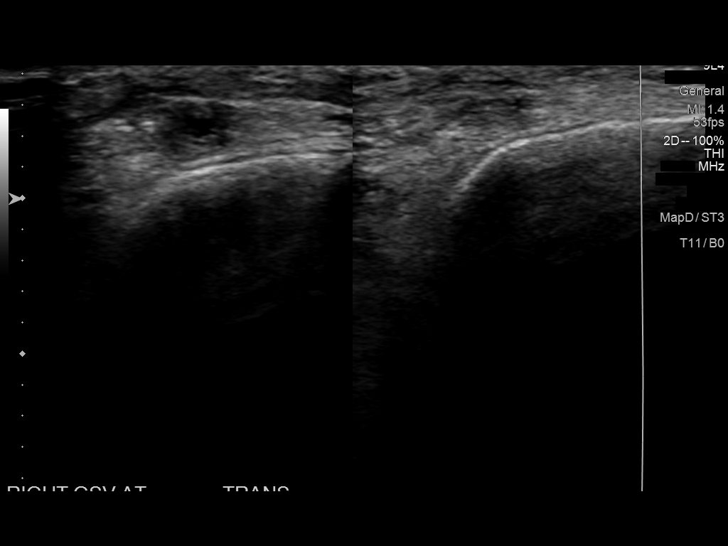
[im 37/37]
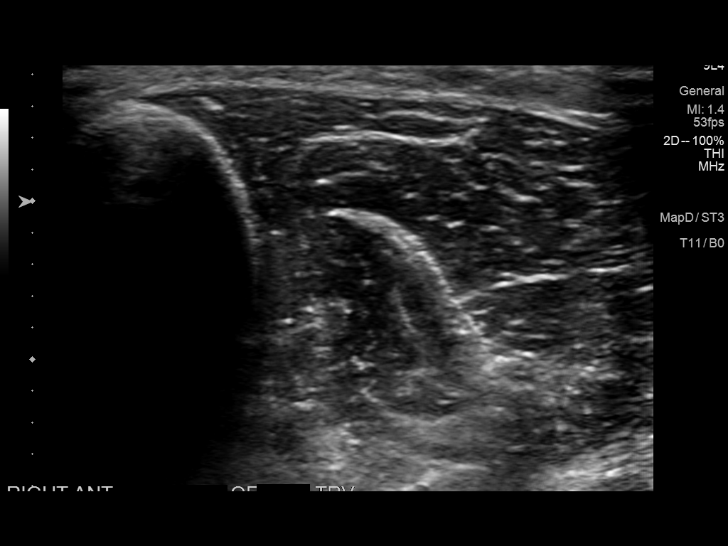

[13 of 24 positions shown; findings below may reference images not displayed]

FINDINGS: Common Femoral Vein: No evidence of thrombus. Normal
compressibility, respiratory phasicity and response to augmentation.

Saphenofemoral Junction: No evidence of thrombus. Normal
compressibility and flow on color Doppler imaging.

Profunda Femoral Vein: No evidence of thrombus. Normal
compressibility and flow on color Doppler imaging.

Femoral Vein: No evidence of thrombus. Normal compressibility,
respiratory phasicity and response to augmentation.

Popliteal Vein: No evidence of thrombus. Normal compressibility,
respiratory phasicity and response to augmentation.

Calf Veins: No evidence of thrombus. Normal compressibility and flow
on color Doppler imaging.

Superficial Great Saphenous Vein: No evidence of thrombus. Normal
compressibility and flow on color Doppler imaging.

Venous Reflux:  None.

Other Findings:  None.
IMPRESSION: No evidence of deep venous thrombosis.
